# Patient Record
Sex: Male | Born: 2005 | Race: Black or African American | Hispanic: No | Marital: Single
Health system: Southern US, Community
[De-identification: ages and names within clinical notes are randomized; demographics above are authoritative.]

---
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
## 2024-01-30 ENCOUNTER — Ambulatory Visit: Admitting: Family Medicine

## 2024-07-25 ENCOUNTER — Ambulatory Visit: Admitting: Nurse Practitioner

## 2024-12-20 ENCOUNTER — Ambulatory Visit: Admitting: Nurse Practitioner
